# Patient Record
Sex: Male | Born: 1950 | Race: Black or African American | Hispanic: No | Marital: Single
Health system: Southern US, Community
[De-identification: ages and names within clinical notes are randomized; demographics above are authoritative.]

## PROBLEM LIST (undated history)

## (undated) DIAGNOSIS — E119 Type 2 diabetes mellitus without complications: Secondary | ICD-10-CM

---

## 2018-05-18 ENCOUNTER — Emergency Department: Payer: Worker's Compensation

## 2018-05-18 ENCOUNTER — Encounter: Payer: Self-pay | Admitting: Emergency Medicine

## 2018-05-18 ENCOUNTER — Other Ambulatory Visit: Payer: Self-pay

## 2018-05-18 ENCOUNTER — Emergency Department
Admission: EM | Admit: 2018-05-18 | Discharge: 2018-05-18 | Disposition: A | Discharge status: Home / Self Care | Payer: Worker's Compensation | Attending: Emergency Medicine | Admitting: Emergency Medicine

## 2018-05-18 DIAGNOSIS — S5292XA Unspecified fracture of left forearm, initial encounter for closed fracture: Secondary | ICD-10-CM | POA: Insufficient documentation

## 2018-05-18 DIAGNOSIS — W378XXA Explosion and rupture of other pressurized tire, pipe or hose, initial encounter: Secondary | ICD-10-CM | POA: Diagnosis not present

## 2018-05-18 DIAGNOSIS — Y929 Unspecified place or not applicable: Secondary | ICD-10-CM | POA: Insufficient documentation

## 2018-05-18 DIAGNOSIS — Y990 Civilian activity done for income or pay: Secondary | ICD-10-CM | POA: Insufficient documentation

## 2018-05-18 DIAGNOSIS — E119 Type 2 diabetes mellitus without complications: Secondary | ICD-10-CM | POA: Insufficient documentation

## 2018-05-18 DIAGNOSIS — S59912A Unspecified injury of left forearm, initial encounter: Secondary | ICD-10-CM | POA: Diagnosis present

## 2018-05-18 DIAGNOSIS — Y9389 Activity, other specified: Secondary | ICD-10-CM | POA: Insufficient documentation

## 2018-05-18 HISTORY — DX: Type 2 diabetes mellitus without complications: E11.9

## 2018-05-18 MED ORDER — HYDROMORPHONE HCL 1 MG/ML IJ SOLN: 1.0000 mg | Freq: Once | INTRAMUSCULAR | Status: DC

## 2018-05-18 MED ORDER — HYDROMORPHONE HCL 1 MG/ML IJ SOLN
1.0000 mg | Freq: Once | INTRAMUSCULAR | Status: AC
  Administered 2018-05-18: 1 mg via INTRAVENOUS
  Filled 2018-05-18: qty 1

## 2018-05-18 MED ORDER — KETOROLAC TROMETHAMINE 60 MG/2ML IM SOLN
30.0000 mg | Freq: Once | INTRAMUSCULAR | Status: DC
  Filled 2018-05-18: qty 2

## 2018-05-18 MED ORDER — HYDROMORPHONE HCL 1 MG/ML IJ SOLN
1.0000 mg | Freq: Once | INTRAMUSCULAR | Status: DC
  Filled 2018-05-18: qty 1

## 2018-05-18 MED ORDER — KETOROLAC TROMETHAMINE 30 MG/ML IJ SOLN
30.0000 mg | Freq: Once | INTRAMUSCULAR | Status: AC
  Administered 2018-05-18: 30 mg via INTRAVENOUS

## 2018-05-18 MED ORDER — HYDROMORPHONE HCL 1 MG/ML IJ SOLN
1.0000 mg | Freq: Once | INTRAMUSCULAR | Status: AC
  Administered 2018-05-18: 1 mg via INTRAVENOUS

## 2018-05-18 MED ORDER — OXYCODONE-ACETAMINOPHEN 7.5-325 MG PO TABS: 1.0000 | ORAL_TABLET | Freq: Four times a day (QID) | ORAL | 0 refills | Status: AC | PRN
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
## 2018-05-18 NOTE — ED Notes (Signed)
Walked patient's drug screen to lab .
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
## 2018-05-18 NOTE — ED Notes (Addendum)
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
## 2018-05-18 NOTE — Discharge Instructions (Addendum)
Wear splint and sling until evaluation by orthopedics.  Follow discharge care instruction take pain medication as directed. Advised to follow-up with your workers [employer] carrier for authorization of local orthopedics.  Contact information as follows. [employer] [address] [employer] #[phone]
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
## 2019-03-11 IMAGING — CR DG SHOULDER 2+V*R*
3 series · 3 of 3 positions shown · non-contrast
Comparison: No prior.

CLINICAL DATA: Injured arm.

EXAM:
RIGHT SHOULDER - 2+ VIEW

[shoulder grashey]
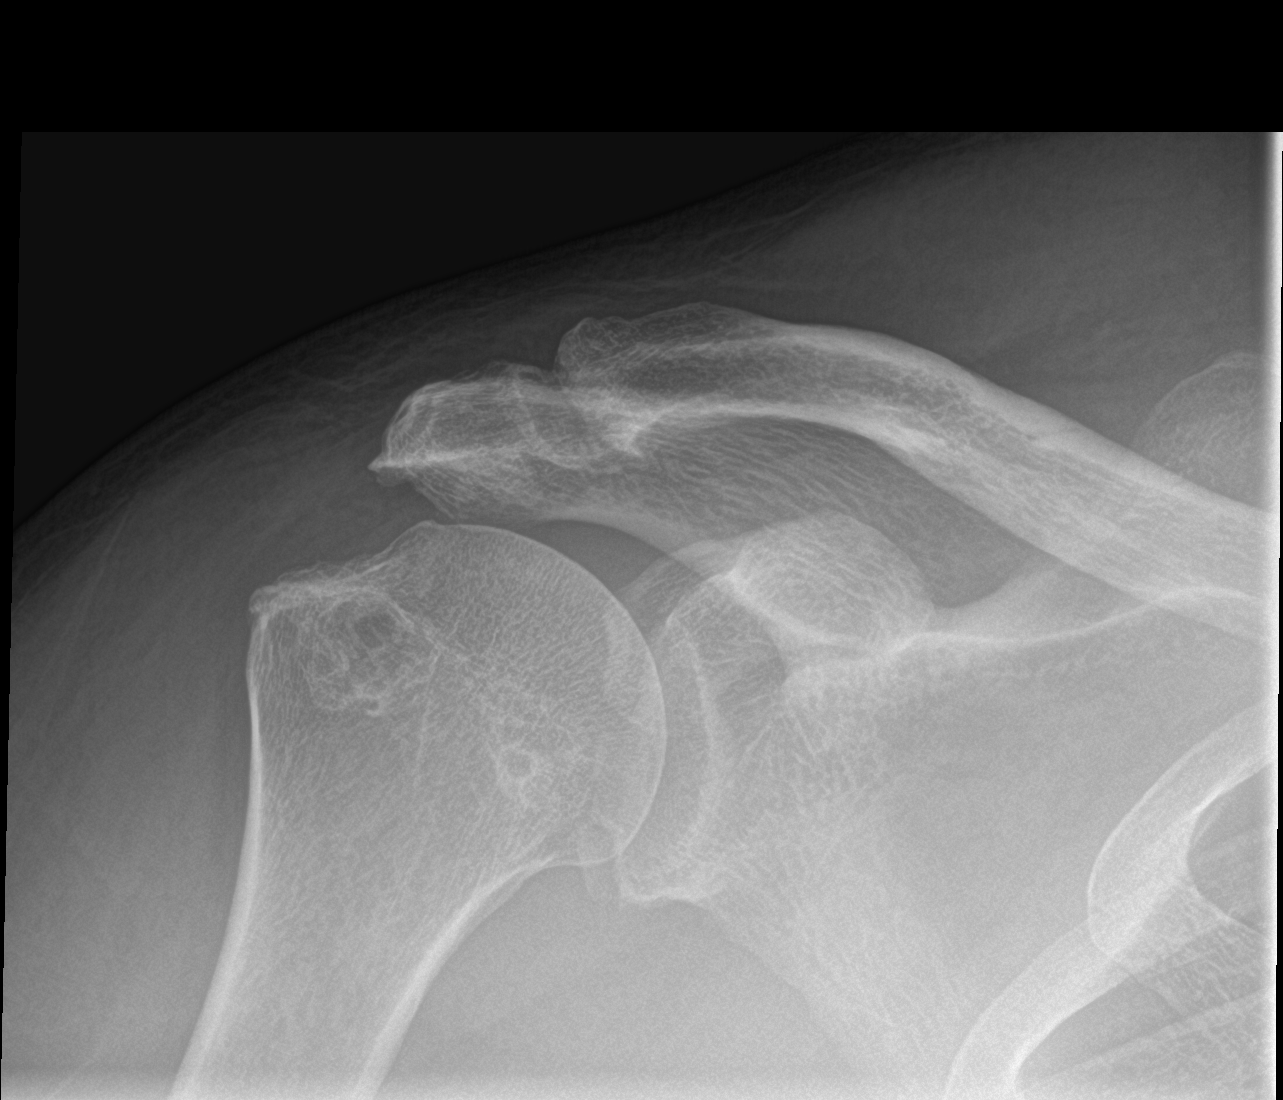

[shoulder y view]
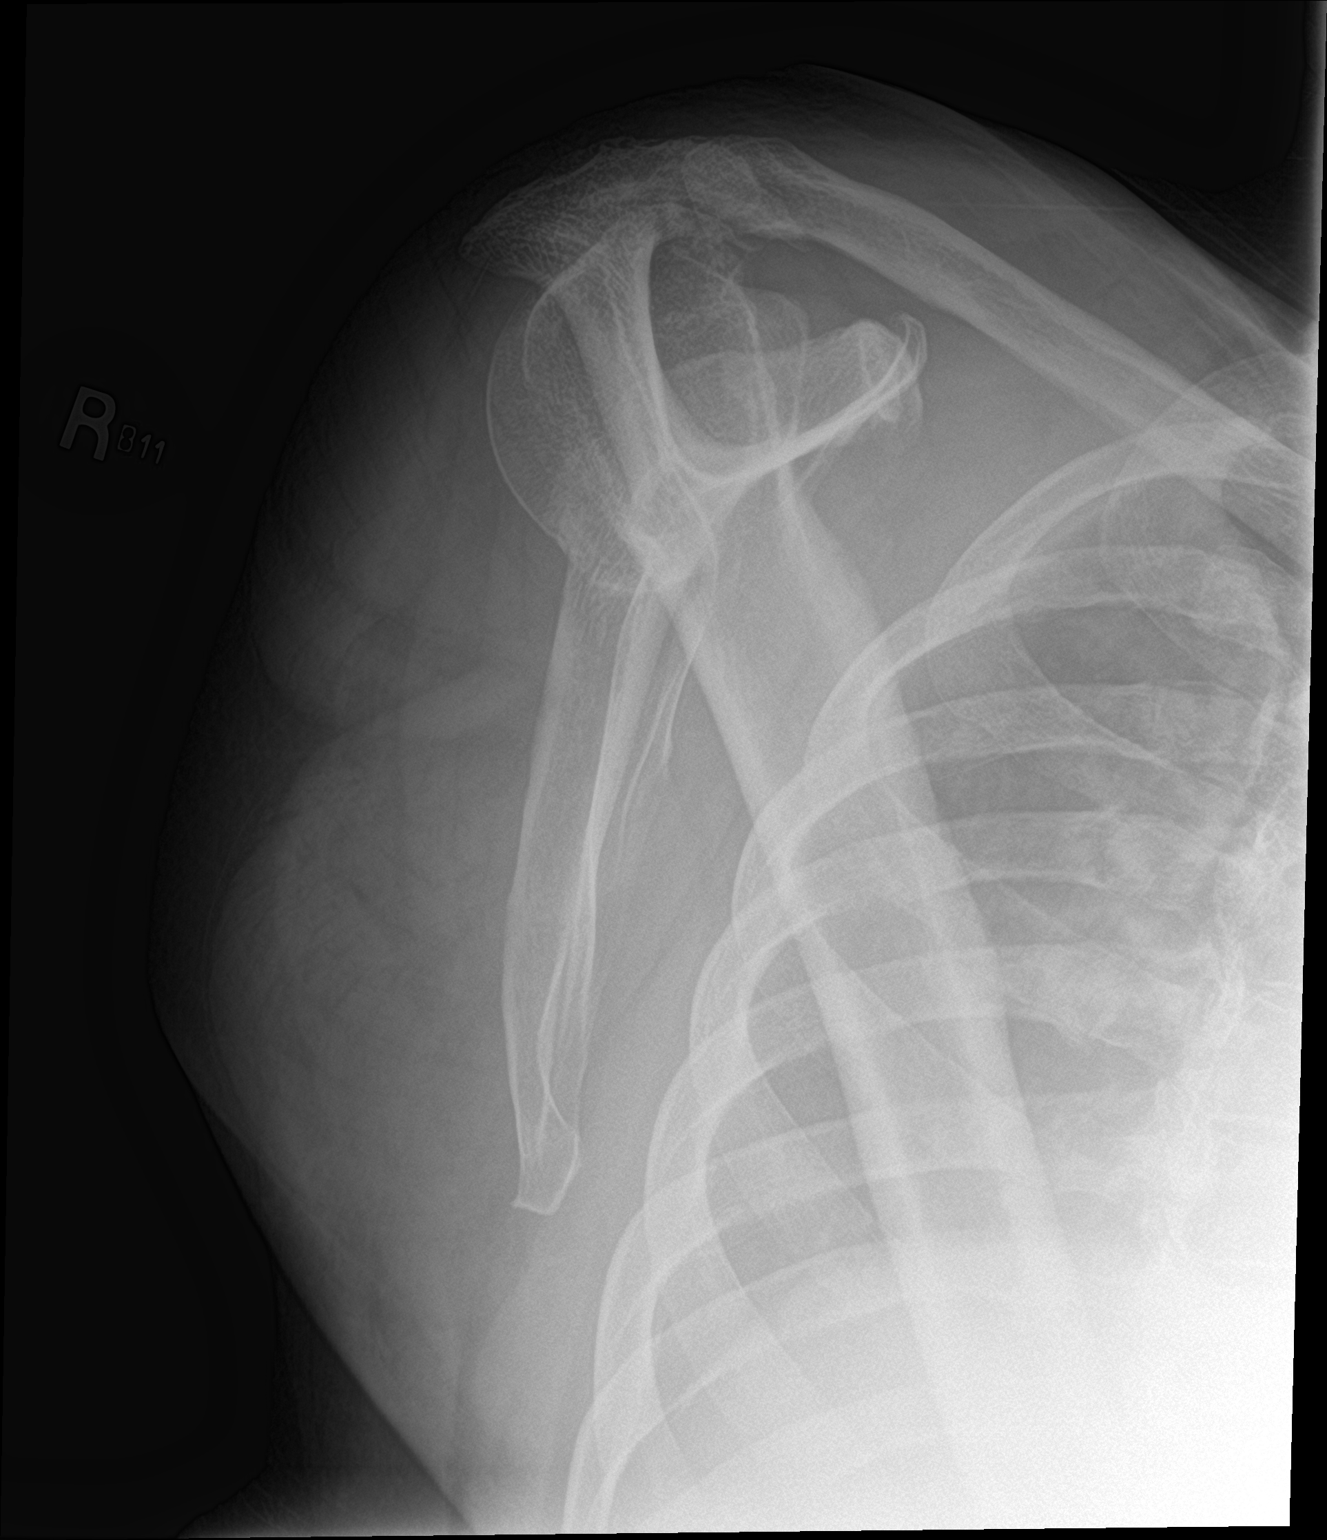

[shoulder axillary]
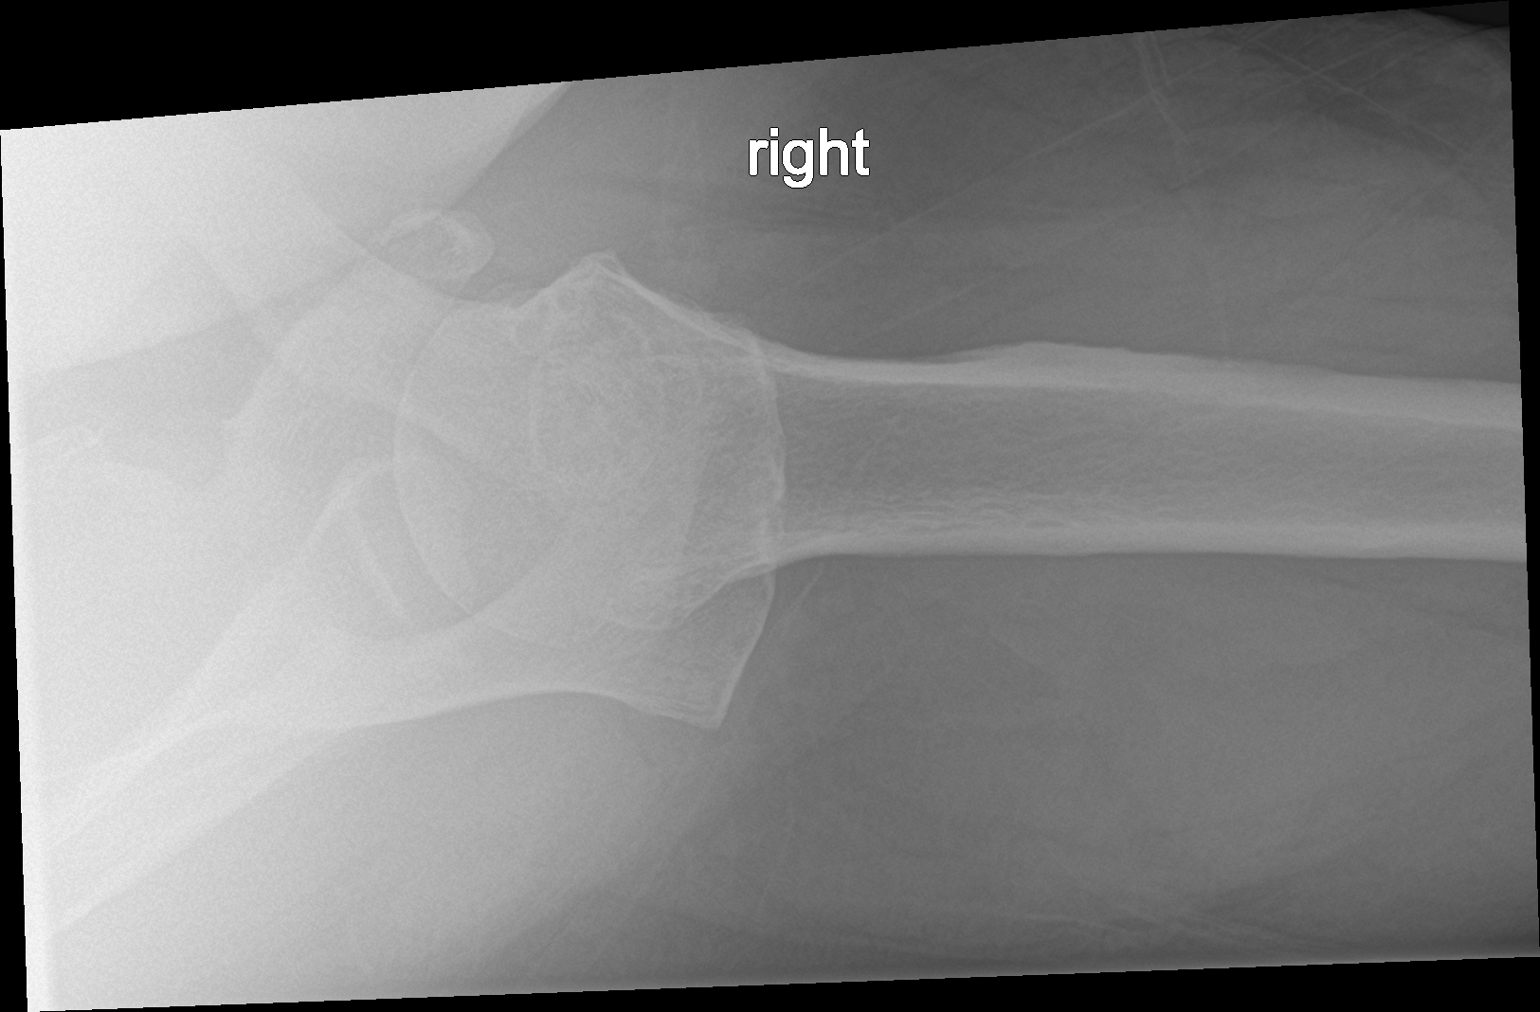

[3 of 3 positions shown; findings below may reference images not displayed]

FINDINGS: Acromioclavicular and glenohumeral degenerative change. No evidence
of fracture or dislocation. No acute abnormality.
IMPRESSION: Acromioclavicular glenohumeral degenerative change. No acute
abnormality
